# Patient Record
Sex: Male | Born: 1990 | Race: White | Hispanic: No | Marital: Single | Smoking: Never smoker
Health system: Southern US, Community
[De-identification: ages and names within clinical notes are randomized; demographics above are authoritative.]

## PROBLEM LIST (undated history)

## (undated) DIAGNOSIS — F988 Other specified behavioral and emotional disorders with onset usually occurring in childhood and adolescence: Secondary | ICD-10-CM

---
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
## 2008-03-09 ENCOUNTER — Encounter: Admission: RE | Admit: 2008-03-09 | Discharge: 2008-03-09 | Payer: Self-pay | Admitting: General Surgery

## 2008-11-04 ENCOUNTER — Emergency Department (HOSPITAL_COMMUNITY): Admission: EM | Admit: 2008-11-04 | Discharge: 2008-11-04 | Payer: Self-pay | Admitting: Emergency Medicine

## 2010-04-18 IMAGING — CT CT PELVIS W/ CM
2 of 5 series · 15 of 42 positions shown, 19 images · IV contrast (30CC OMNI 350 & [ID] OMNI 300)
Comparison: None available

 CT ABDOMEN

March 16, 2008 –DUPLICATE COPY for exam association in [facility]. No change from original report.
CLINICAL DATA: Abdominal pain. Evaluate for appendicitis.

 CT ABDOMEN AND PELVIS WITH CONTRAST
TECHNIQUE: Multidetector CT imaging of the abdomen and pelvis was
 performed using the standard protocol following bolus
 administration of intravenous contrast.
 Contrast: 100 ml [id number].

[Series 2: abdomen w/ · axial · 0.70mm/px · z∈[-378,-3]mm · 12 of 85 slices shown, 16 images]
[im 5/85  soft-tissue]
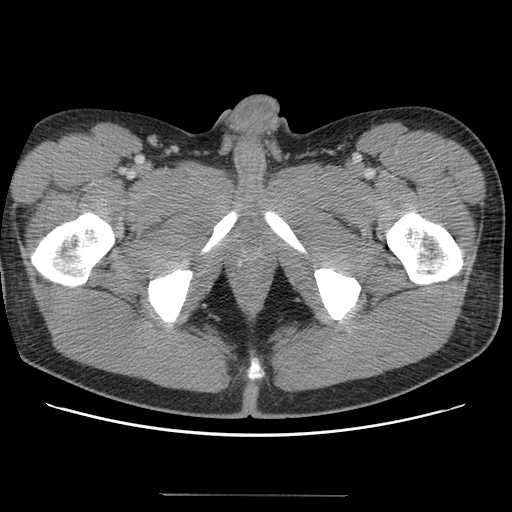
[im 5/85  bone]
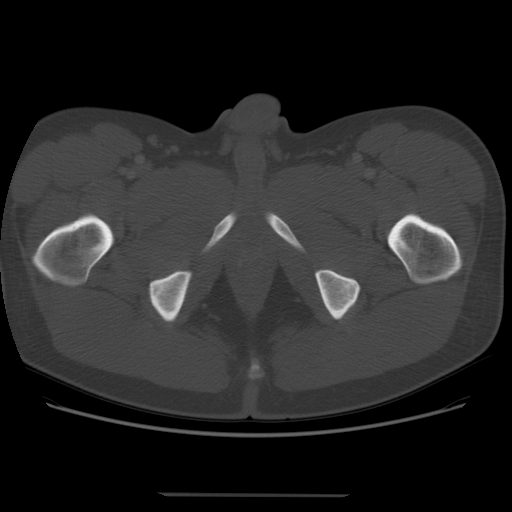
[im 14/85  soft-tissue]
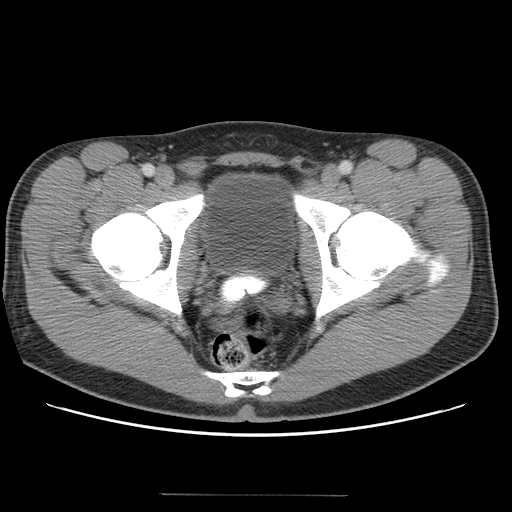
[im 23/85  soft-tissue]
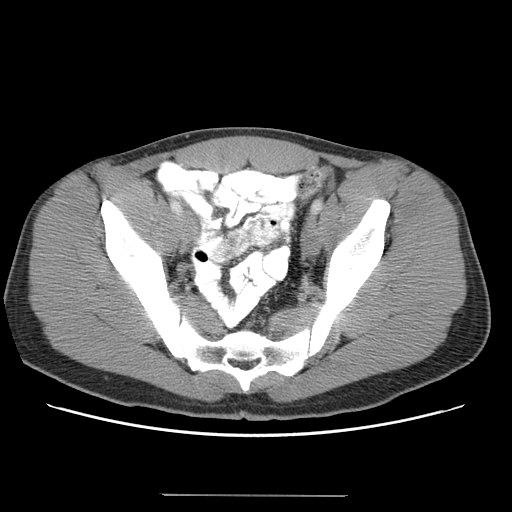
[im 31/85  soft-tissue]
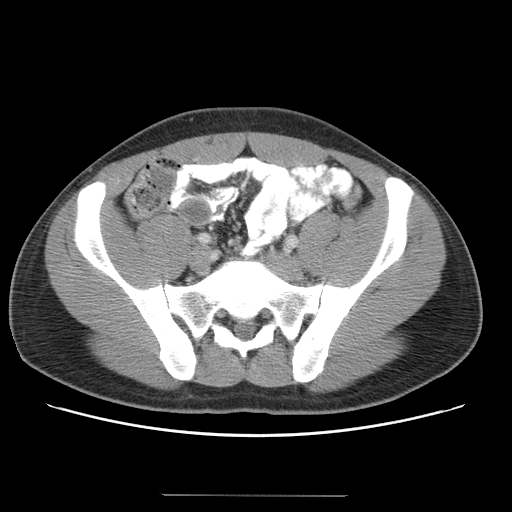
[im 40/85  soft-tissue]
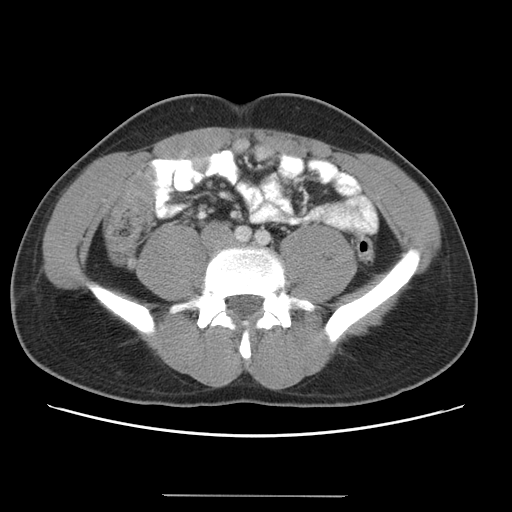
[im 45/85  soft-tissue]
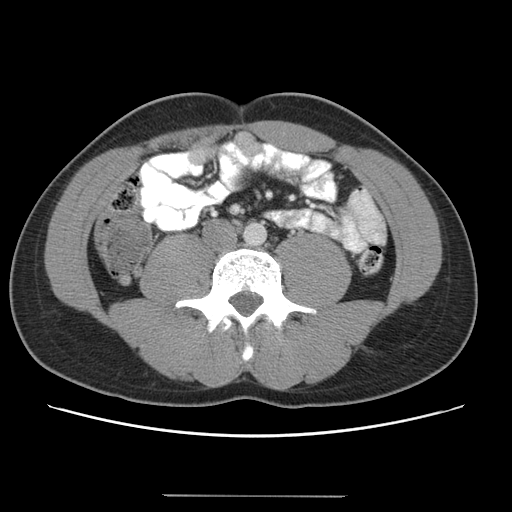
[im 54/85  soft-tissue]
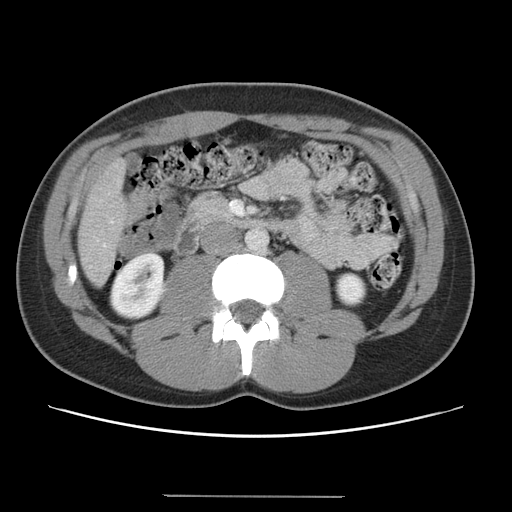
[im 62/85  soft-tissue]
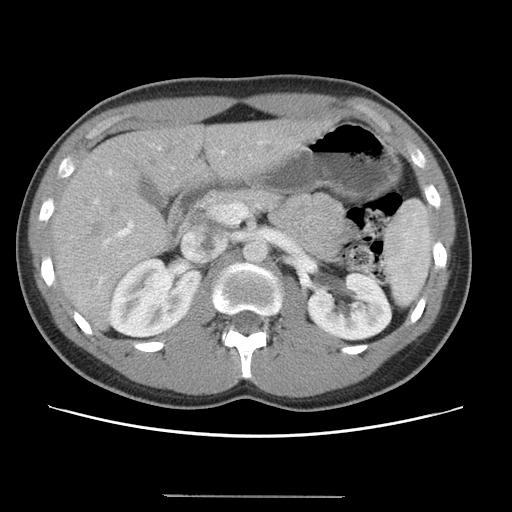
[im 67/85  lung]
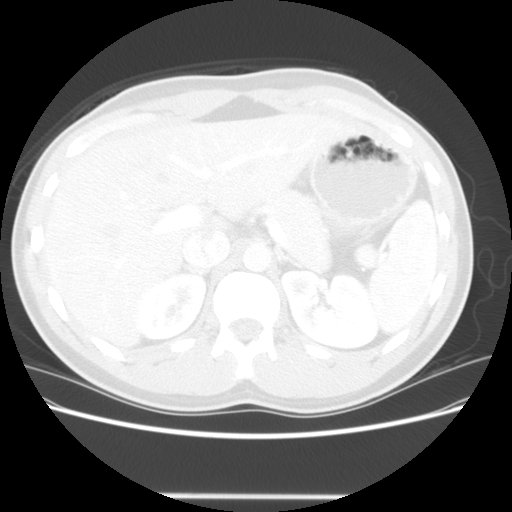
[im 71/85  soft-tissue]
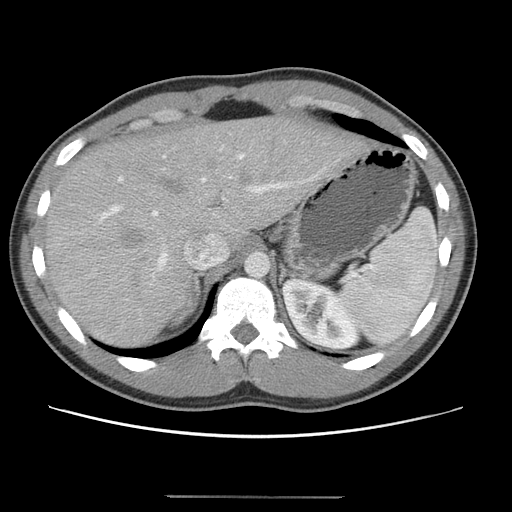
[im 71/85  lung]
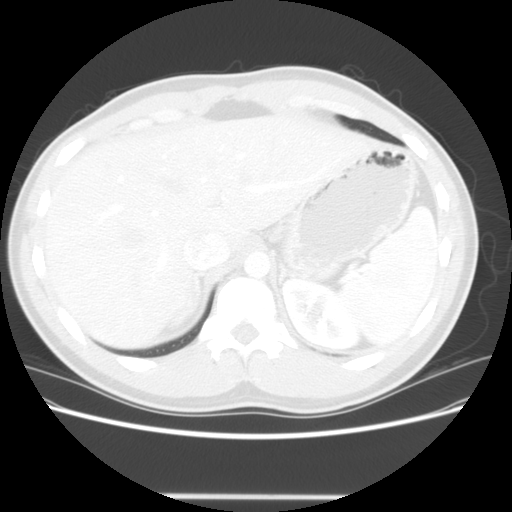
[im 71/85  bone]
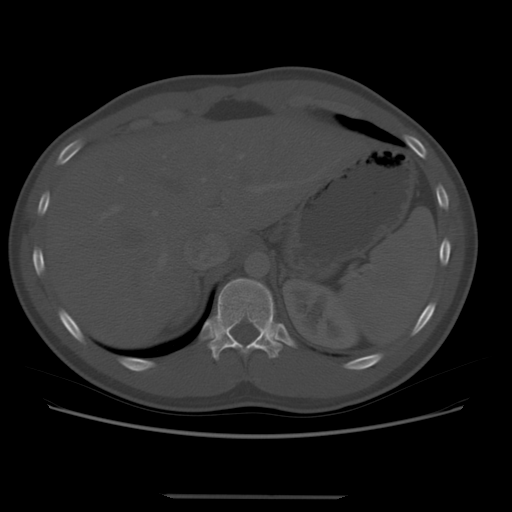
[im 76/85  lung]
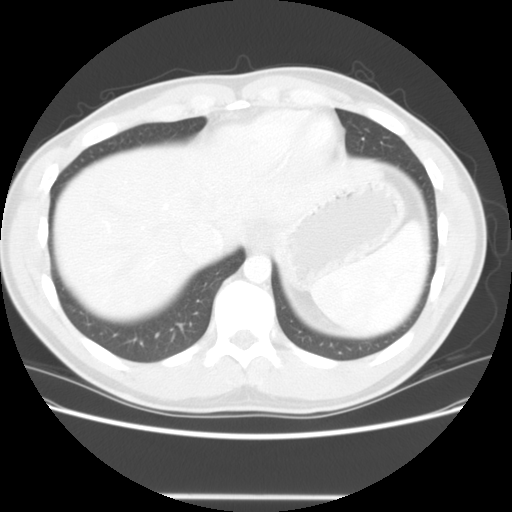
[im 80/85  soft-tissue]
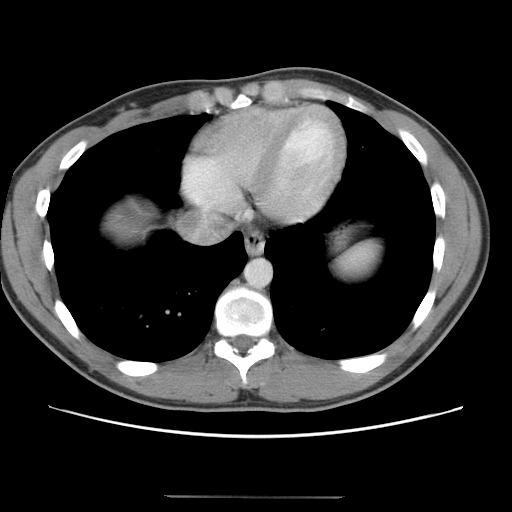
[im 80/85  lung]
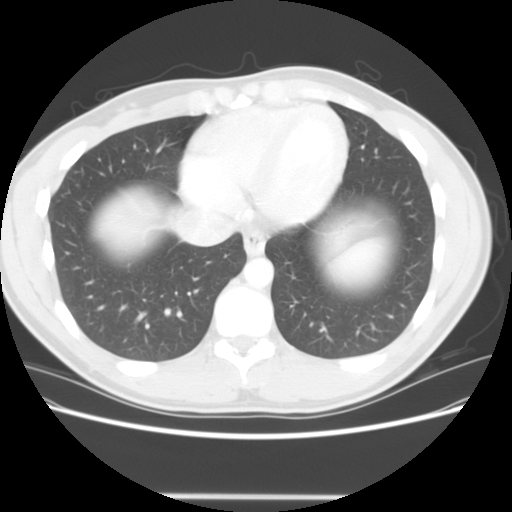

[Series 400: sagittal · sagittal · 0.85mm/px · 3 of 114 slices shown]
[im 29/114  soft-tissue]
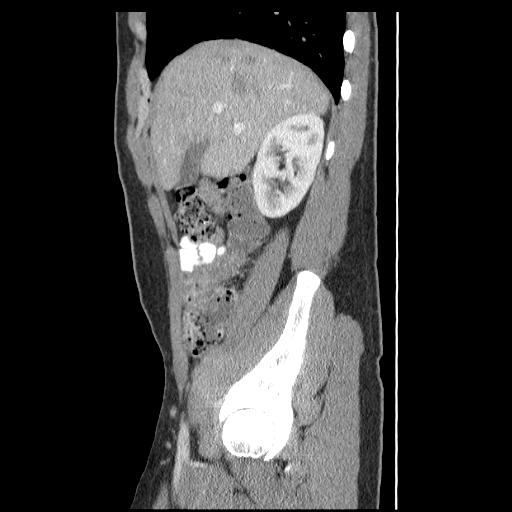
[im 57/114  soft-tissue]
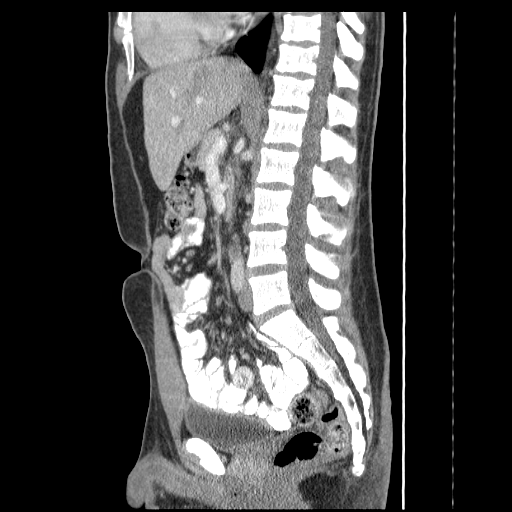
[im 85/114  soft-tissue]
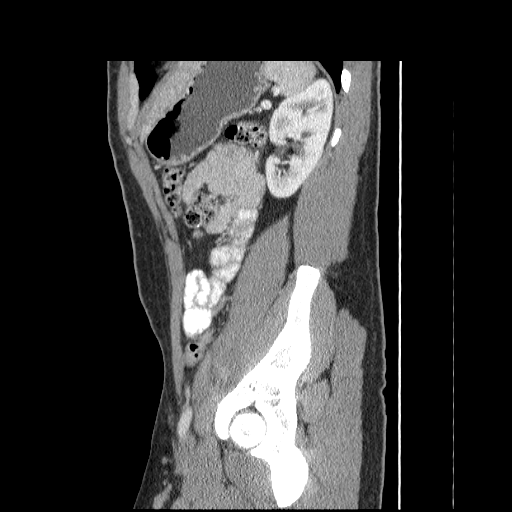

[15 of 42 positions shown; findings below may reference images not displayed]

FINDINGS: Lung bases clear. Liver and spleen appear within normal
 limits. Kidneys demonstrate normal nephrographic enhancement
 bilaterally. IVC and aorta and normal. Gallbladder is
 decompressed. The stomach and proximal small bowel appear normal.
 Pancreas and common bile duct appear normal.
IMPRESSION: No acute abdominal abnormality.

 CT PELVIS
FINDINGS: Moderate fecal burden is present. Normal appendix
 identified in a retrocecal location, tracking up towards the tip of
 the liver. The tip is filled with air and there are no
 inflammatory changes around the appendix. Pelvic small bowel
 appears within normal limits. No free fluid. Urinary bladder
 unremarkable. No free air is present in the abdomen. Bones appear
 within normal limits.
IMPRESSION: No acute pelvic abnormality. Normal appendix identified.

## 2010-12-14 IMAGING — CR DG CHEST 2V
2 series · 2 of 2 positions shown · non-contrast
Comparison: None

CLINICAL DATA: Status post fall; shortness of breath.

CHEST - 2 VIEW

[w chest pa]
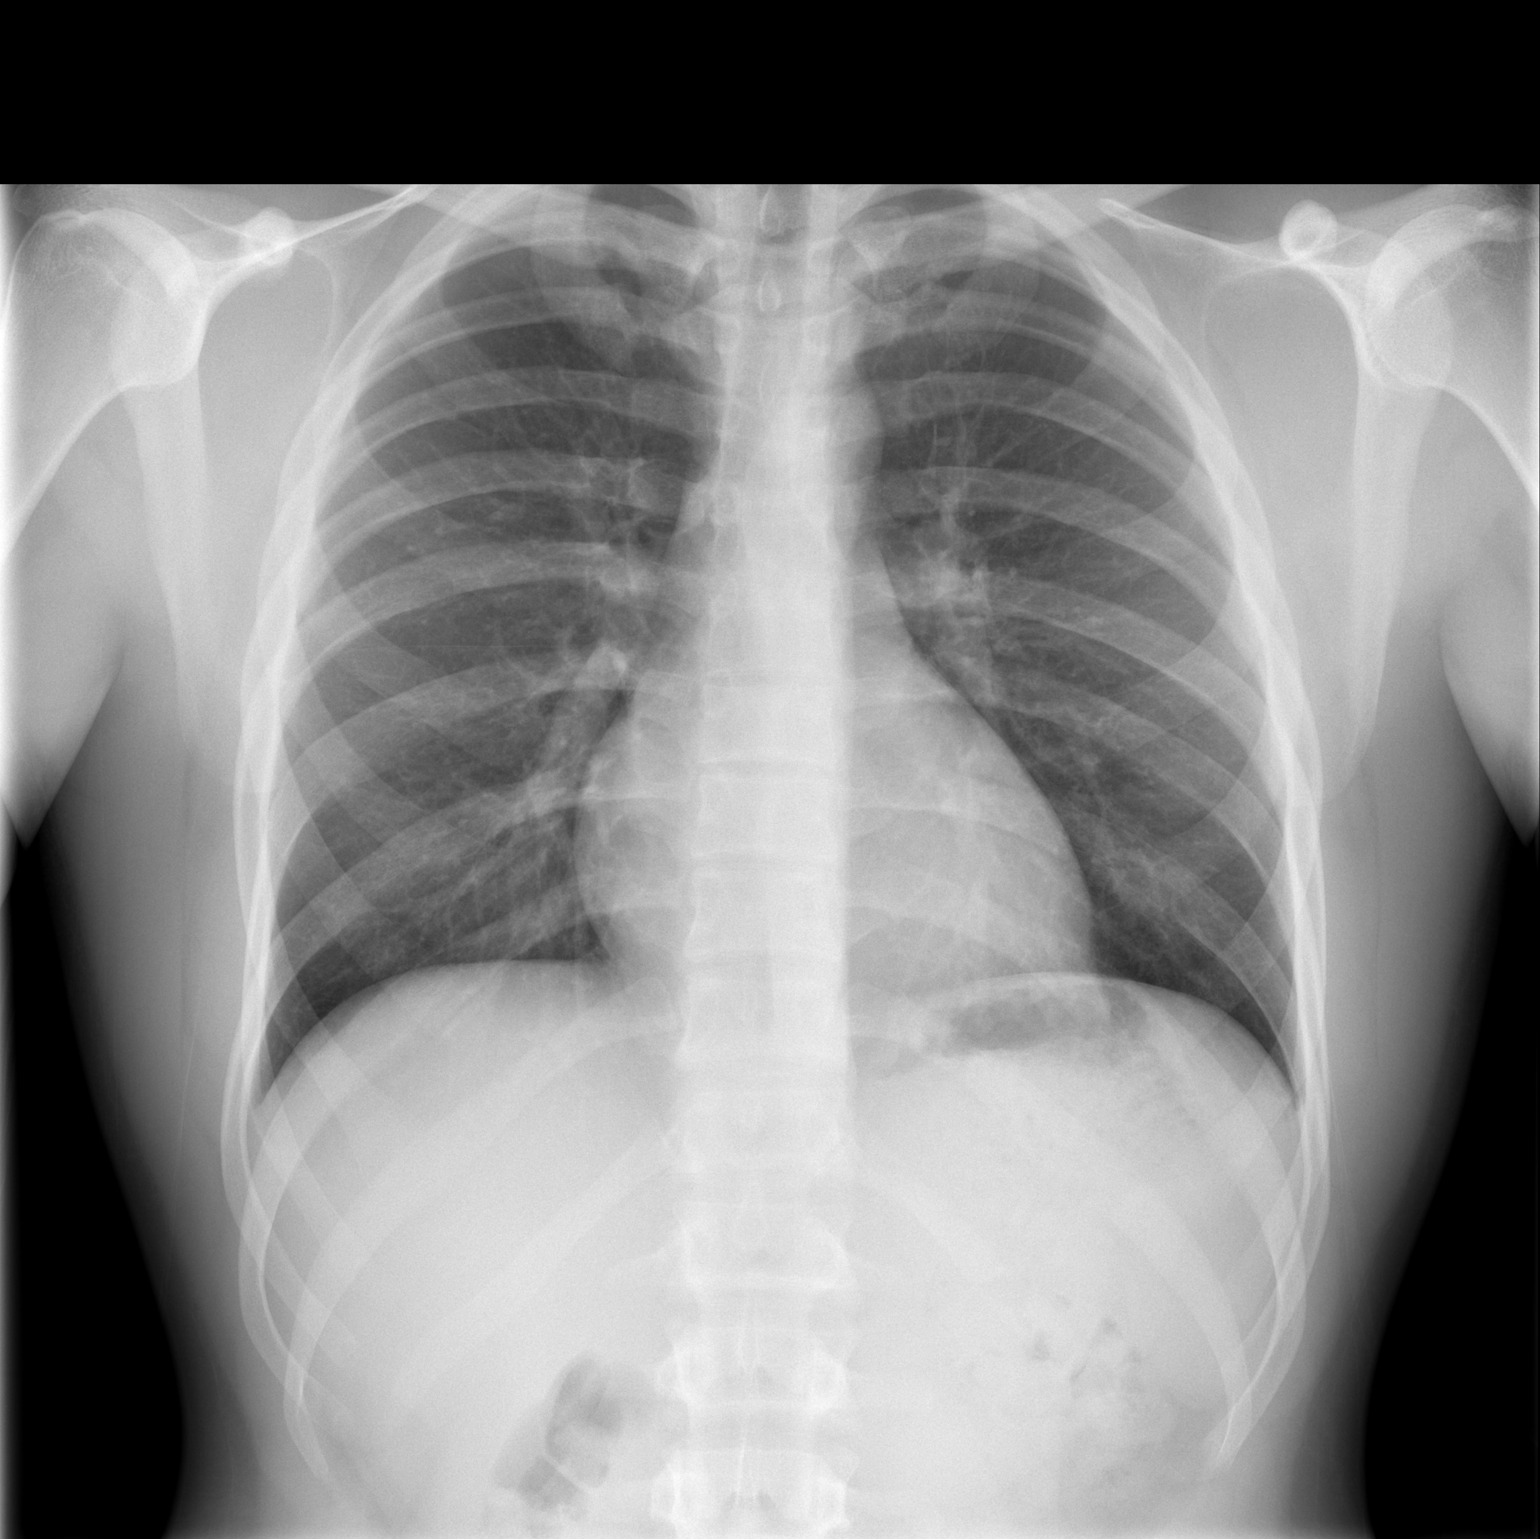

[w chest lat]
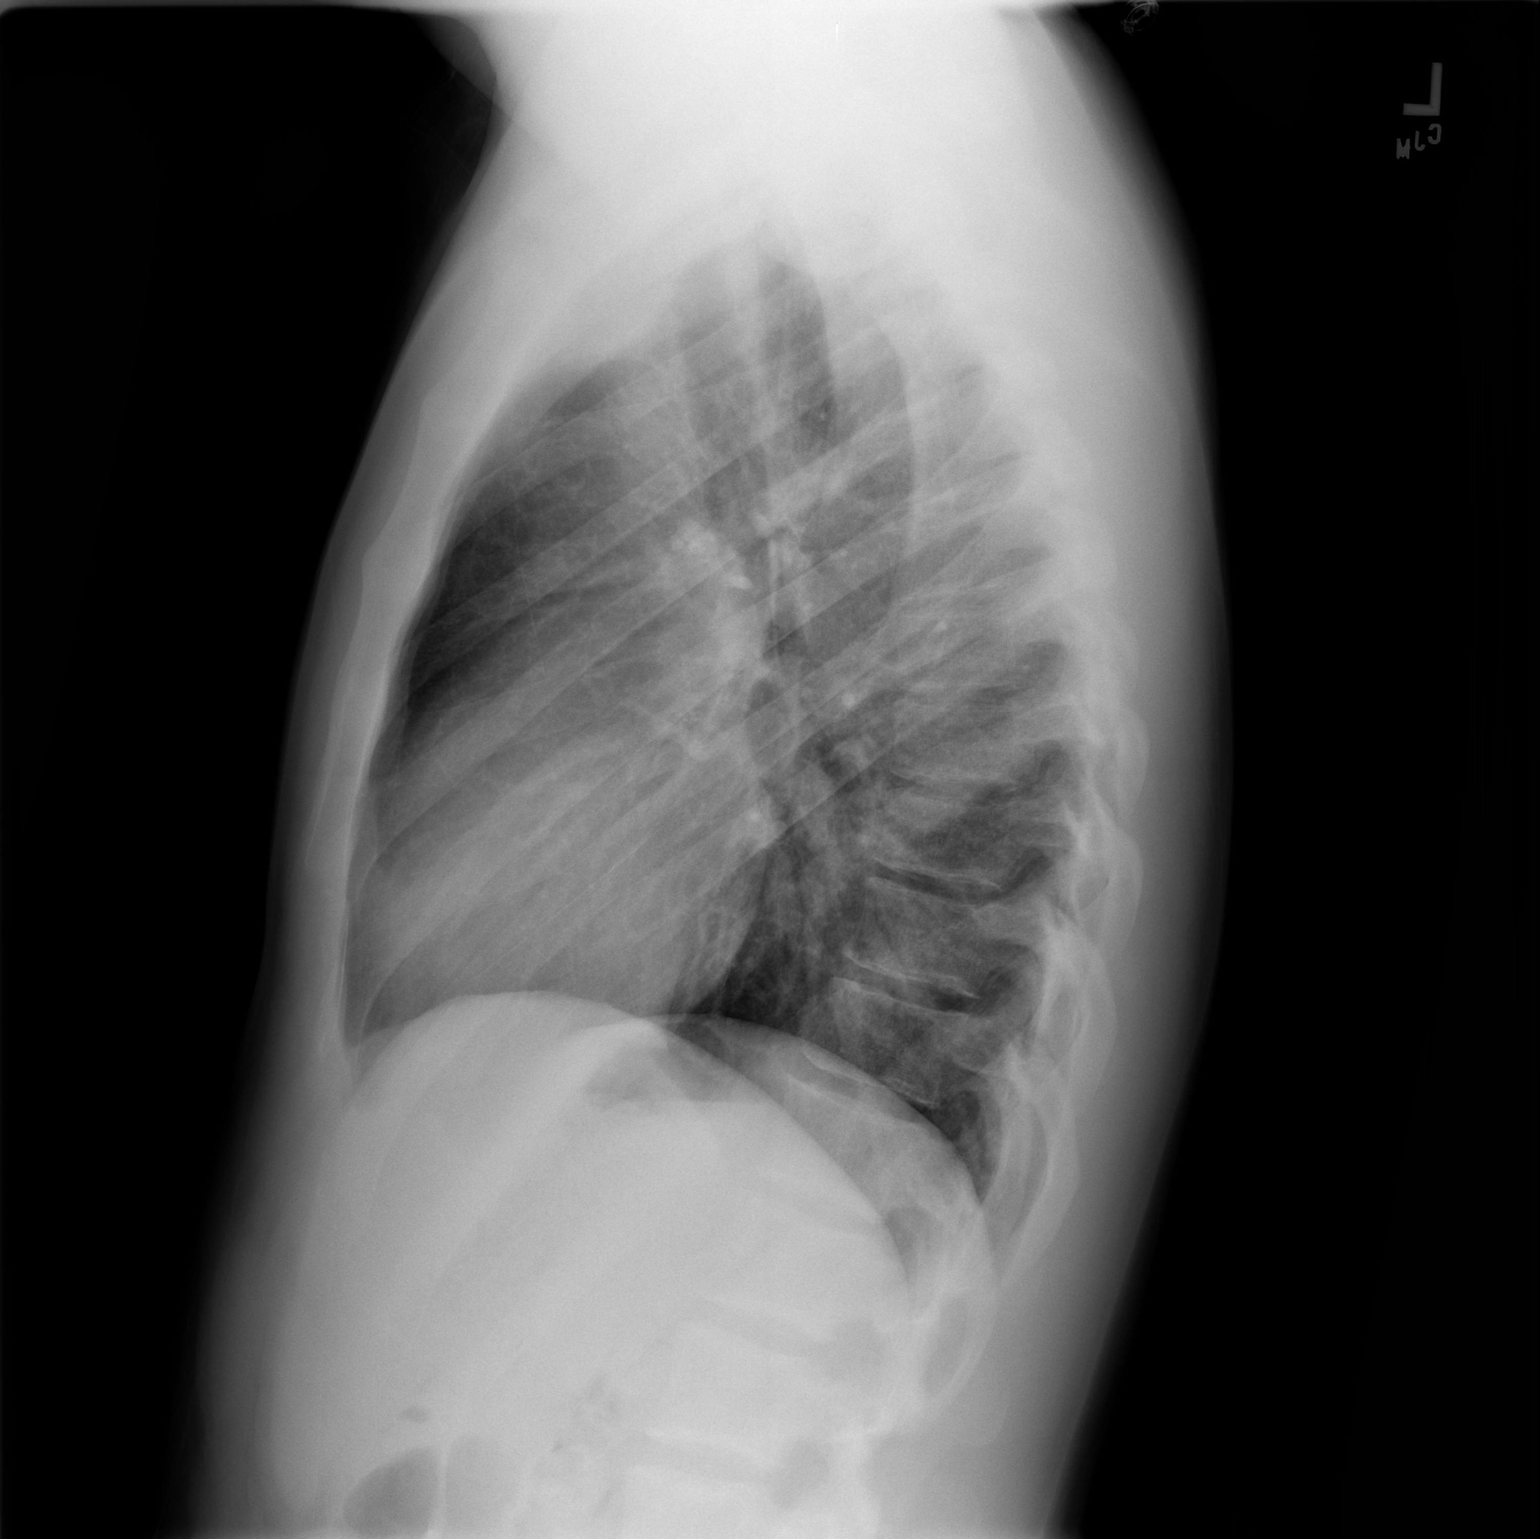

[2 of 2 positions shown; findings below may reference images not displayed]

FINDINGS: The lungs are well-aerated and clear.  There is no
evidence of focal opacification, pleural effusion or pneumothorax.

The heart is normal in size; the mediastinal contour is within
normal limits.  No displaced rib fractures are identified; no acute
osseous abnormalities are seen.
IMPRESSION: No displaced rib fractures seen; no acute cardiopulmonary process
evident.

## 2012-08-28 ENCOUNTER — Ambulatory Visit (INDEPENDENT_AMBULATORY_CARE_PROVIDER_SITE_OTHER): Payer: BC Managed Care – PPO | Admitting: General Surgery

## 2012-08-28 DIAGNOSIS — L02519 Cutaneous abscess of unspecified hand: Secondary | ICD-10-CM

## 2012-08-28 DIAGNOSIS — L03119 Cellulitis of unspecified part of limb: Secondary | ICD-10-CM

## 2012-08-28 MED ORDER — CEPHALEXIN 500 MG PO CAPS: 500.0000 mg | ORAL_CAPSULE | Freq: Four times a day (QID) | ORAL | Status: AC

## 2012-08-28 NOTE — Patient Instructions (Signed)
Cellulitis Cellulitis is an infection of the skin and the tissue beneath it. The infected area is usually red and tender. Cellulitis occurs most often in the arms and lower legs.  CAUSES  Cellulitis is caused by bacteria that enter the skin through cracks or cuts in the skin. The most common types of bacteria that cause cellulitis are Staphylococcus and Streptococcus. SYMPTOMS   Redness and warmth.  Swelling.  Tenderness or pain.  Fever. DIAGNOSIS  Your caregiver can usually determine what is wrong based on a physical exam. Blood tests may also be done. TREATMENT  Treatment usually involves taking an antibiotic medicine. HOME CARE INSTRUCTIONS   Take your antibiotics as directed. Finish them even if you start to feel better.  Keep the infected arm or leg elevated to reduce swelling.  Apply a warm cloth to the affected area up to 4 times per day to relieve pain.  Only take over-the-counter or prescription medicines for pain, discomfort, or fever as directed by your caregiver.  Keep all follow-up appointments as directed by your caregiver. SEEK MEDICAL CARE IF:   You notice red streaks coming from the infected area.  Your red area gets larger or turns dark in color.  Your bone or joint underneath the infected area becomes painful after the skin has healed.  Your infection returns in the same area or another area.  You notice a swollen bump in the infected area.  You develop new symptoms. SEEK IMMEDIATE MEDICAL CARE IF:   You have a fever.  You feel very sleepy.  You develop vomiting or diarrhea.  You have a general ill feeling (malaise) with muscle aches and pains. MAKE SURE YOU:   Understand these instructions.  Will watch your condition.  Will get help right away if you are not doing well or get worse. Document Released: 12/06/2004 Document Revised: 08/28/2011 Document Reviewed: 05/14/2011 [employer] Patient Information 2014 [employer].  

## 2012-08-28 NOTE — Progress Notes (Signed)
Chief complaint: Possible dissection after Scratch  History: Patient 2 days ago with scratch on the back of his left hand by his catheter. Last night and today he notices some increased redness and swelling on the back of his hand. No fever or chills.  Exam: There were no vitals taken for this visit. General: Healthy-appearing male Extremities: There are some superficial scratches on the dorsum of the left hand. There is mild to moderate erythema over the distal dorsum of the hand with 1+ swelling on the back of the hand. No purulence or fluctuance.  Assessment and plan: Mild cellulitis left hand after a cat scratch 2 days ago. I recommended Keflex 4 times a day with elevation. He is given instructions and understands to call immediately for any worsening symptoms.

## 2013-12-15 ENCOUNTER — Encounter (HOSPITAL_BASED_OUTPATIENT_CLINIC_OR_DEPARTMENT_OTHER): Payer: Self-pay | Admitting: Emergency Medicine

## 2013-12-15 ENCOUNTER — Emergency Department (HOSPITAL_BASED_OUTPATIENT_CLINIC_OR_DEPARTMENT_OTHER)
Admission: EM | Admit: 2013-12-15 | Discharge: 2013-12-16 | Disposition: A | Discharge status: Home / Self Care | Payer: BC Managed Care – PPO | Attending: Emergency Medicine | Admitting: Emergency Medicine

## 2013-12-15 DIAGNOSIS — S61203A Unspecified open wound of left middle finger without damage to nail, initial encounter: Secondary | ICD-10-CM | POA: Diagnosis not present

## 2013-12-15 DIAGNOSIS — Y9289 Other specified places as the place of occurrence of the external cause: Secondary | ICD-10-CM | POA: Insufficient documentation

## 2013-12-15 DIAGNOSIS — W270XXA Contact with workbench tool, initial encounter: Secondary | ICD-10-CM | POA: Insufficient documentation

## 2013-12-15 DIAGNOSIS — S61219A Laceration without foreign body of unspecified finger without damage to nail, initial encounter: Secondary | ICD-10-CM

## 2013-12-15 DIAGNOSIS — Z79899 Other long term (current) drug therapy: Secondary | ICD-10-CM | POA: Insufficient documentation

## 2013-12-15 DIAGNOSIS — Y9389 Activity, other specified: Secondary | ICD-10-CM | POA: Diagnosis not present

## 2013-12-15 DIAGNOSIS — F909 Attention-deficit hyperactivity disorder, unspecified type: Secondary | ICD-10-CM | POA: Diagnosis not present

## 2013-12-15 HISTORY — DX: Other specified behavioral and emotional disorders with onset usually occurring in childhood and adolescence: F98.8

## 2013-12-15 NOTE — ED Provider Notes (Signed)
CSN: [id number]     Arrival date & time 12/15/13  2143 History   First MD Initiated Contact with Patient 12/15/13 2328     Chief Complaint  Patient presents with  . Extremity Laceration     (Consider location/radiation/quality/duration/timing/severity/associated sxs/prior Treatment) HPI Comments: Pt c/o laceration to left ring finger by metal tool x 1 hr ago  Patient is a 23 y.o. male presenting with hand injury. The history is provided by the patient. No language interpreter was used.  Hand Injury Location:  Finger Time since incident:  1 hour Injury: yes   Mechanism of injury comment:  Lacerated finger w/ metal wood carving tool Finger location:  L middle finger Pain details:    Quality:  Aching   Radiates to:  Does not radiate   Severity:  Mild   Timing:  Constant   Progression:  Unchanged Chronicity:  New Handedness:  Right-handed Dislocation: no   Foreign body present:  No foreign bodies Tetanus status:  Up to date Prior injury to area:  No Relieved by:  Immobilization Worsened by:  Movement Associated symptoms: no back pain, no fatigue and no fever     Past Medical History  Diagnosis Date  . ADD (attention deficit disorder)    History reviewed. No pertinent past surgical history. History reviewed. No pertinent family history. History  Substance Use Topics  . Smoking status: Never Smoker   . Smokeless tobacco: Not on file  . Alcohol Use: No    Review of Systems  Constitutional: Negative for fever, activity change, appetite change and fatigue.  HENT: Negative for congestion, facial swelling, rhinorrhea and trouble swallowing.   Eyes: Negative for photophobia and pain.  Respiratory: Negative for cough, chest tightness and shortness of breath.   Cardiovascular: Negative for chest pain and leg swelling.  Gastrointestinal: Negative for nausea, vomiting, abdominal pain, diarrhea and constipation.  Endocrine: Negative for polydipsia and polyuria.  Genitourinary:  Negative for dysuria, urgency, decreased urine volume and difficulty urinating.  Musculoskeletal: Negative for back pain and gait problem.  Skin: Negative for color change, rash and wound.  Allergic/Immunologic: Negative for immunocompromised state.  Neurological: Negative for dizziness, facial asymmetry, speech difficulty, weakness, numbness and headaches.  Psychiatric/Behavioral: Negative for confusion, decreased concentration and agitation.      Allergies  Review of patient's allergies indicates no known allergies.  Home Medications   Prior to Admission medications   Medication Sig Start Date End Date Taking? Authorizing Provider  amphetamine-dextroamphetamine (ADDERALL) 15 MG tablet Take 15 mg by mouth daily.   Yes Historical Provider, MD   BP 123/78  Pulse 78  Temp(Src) 98.8 F (37.1 C) (Oral)  Resp 17  Ht 5\' 11"  (1.803 m)  Wt 215 lb (97.523 kg)  BMI 30.00 kg/m2  SpO2 100% Physical Exam  Constitutional: He is oriented to person, place, and time. He appears well-developed and well-nourished. No distress.  HENT:  Head: Normocephalic and atraumatic.  Mouth/Throat: No oropharyngeal exudate.  Eyes: Pupils are equal, round, and reactive to light.  Neck: Normal range of motion. Neck supple.  Cardiovascular: Normal rate, regular rhythm and normal heart sounds.  Exam reveals no gallop and no friction rub.   No murmur heard. Pulmonary/Chest: Effort normal and breath sounds normal. No respiratory distress. He has no wheezes. He has no rales.  Abdominal: Soft. Bowel sounds are normal. He exhibits no distension and no mass. There is no tenderness. There is no rebound and no guarding.  Musculoskeletal: Normal range of motion. He exhibits  no edema and no tenderness.       Left hand: He exhibits laceration.       Hands: Neurological: He is alert and oriented to person, place, and time.  Skin: Skin is warm and dry.  Psychiatric: He has a normal mood and affect.    ED Course    LACERATION REPAIR Date/Time: 12/15/2013 11:53 PM Performed by: Toy Cookey Authorized by: Toy Cookey Consent: Verbal consent obtained. written consent not obtained. Risks and benefits: risks, benefits and alternatives were discussed Consent given by: patient Patient understanding: patient states understanding of the procedure being performed Patient consent: the patient's understanding of the procedure matches consent given Procedure consent: procedure consent matches procedure scheduled Relevant documents: relevant documents present and verified Test results: test results available and properly labeled Site marked: the operative site was marked Imaging studies: imaging studies available Required items: required blood products, implants, devices, and special equipment available Patient identity confirmed: verbally with patient Time out: Immediately prior to procedure a "time out" was called to verify the correct patient, procedure, equipment, support staff and site/side marked as required. Body area: upper extremity Location details: left ring finger Laceration length: 2 cm Tendon involvement: none Nerve involvement: none Vascular damage: no Anesthesia: local infiltration and digital block Local anesthetic: lidocaine 1% without epinephrine Anesthetic total: 3 ml Patient sedated: no Preparation: Patient was prepped and draped in the usual sterile fashion. Irrigation solution: saline Irrigation method: syringe Amount of cleaning: standard Debridement: none Degree of undermining: none Skin closure: 5-0 Prolene Number of sutures: 5 Technique: simple Approximation: close Approximation difficulty: simple Dressing: antibiotic ointment, 4x4 sterile gauze and splint Patient tolerance: Patient tolerated the procedure well with no immediate complications.   (including critical care time) Labs Review Labs Reviewed - No data to display  Imaging Review No results found.    EKG Interpretation None      MDM   Final diagnoses:  Laceration of finger, left, initial encounter    Pt is a 23 y.o. male with Pmhx as above who presents with laceration to radial side of left ring finger from the woodcarving total approximately one hour ago. Tools were not soiled. Tdap UTD. He is neurovascular intact distally. There is no crush type injury. Laceration was repaired as above. Return precautions given for new worsening symptoms including signs or symptoms of infection. Suture removal recommended in approximately one week.        Toy Cookey, MD 12/16/13 [phone]

## 2013-12-15 NOTE — ED Notes (Signed)
Pt c/o laceration to left ring finger by metal tool x 1 hr ago

## 2013-12-16 NOTE — ED Notes (Signed)
Pt discharged to home with family. NAD.  

## 2013-12-16 NOTE — Discharge Instructions (Signed)

## 2015-06-23 DIAGNOSIS — F909 Attention-deficit hyperactivity disorder, unspecified type: Secondary | ICD-10-CM | POA: Diagnosis not present

## 2015-12-21 DIAGNOSIS — Z23 Encounter for immunization: Secondary | ICD-10-CM | POA: Diagnosis not present

## 2016-02-29 DIAGNOSIS — F909 Attention-deficit hyperactivity disorder, unspecified type: Secondary | ICD-10-CM | POA: Diagnosis not present

## 2016-09-03 DIAGNOSIS — F909 Attention-deficit hyperactivity disorder, unspecified type: Secondary | ICD-10-CM | POA: Diagnosis not present

## 2016-12-17 DIAGNOSIS — B349 Viral infection, unspecified: Secondary | ICD-10-CM | POA: Diagnosis not present

## 2016-12-17 DIAGNOSIS — J029 Acute pharyngitis, unspecified: Secondary | ICD-10-CM | POA: Diagnosis not present

## 2017-02-15 DIAGNOSIS — F909 Attention-deficit hyperactivity disorder, unspecified type: Secondary | ICD-10-CM | POA: Diagnosis not present

## 2017-08-15 DIAGNOSIS — F4322 Adjustment disorder with anxiety: Secondary | ICD-10-CM | POA: Diagnosis not present

## 2017-09-23 DIAGNOSIS — R030 Elevated blood-pressure reading, without diagnosis of hypertension: Secondary | ICD-10-CM | POA: Diagnosis not present

## 2017-09-23 DIAGNOSIS — F909 Attention-deficit hyperactivity disorder, unspecified type: Secondary | ICD-10-CM | POA: Diagnosis not present

## 2017-12-03 DIAGNOSIS — Z23 Encounter for immunization: Secondary | ICD-10-CM | POA: Diagnosis not present

## 2018-04-07 DIAGNOSIS — F909 Attention-deficit hyperactivity disorder, unspecified type: Secondary | ICD-10-CM | POA: Diagnosis not present

## 2018-11-06 DIAGNOSIS — F909 Attention-deficit hyperactivity disorder, unspecified type: Secondary | ICD-10-CM | POA: Diagnosis not present

## 2019-04-09 DIAGNOSIS — F909 Attention-deficit hyperactivity disorder, unspecified type: Secondary | ICD-10-CM | POA: Diagnosis not present

## 2019-11-25 DIAGNOSIS — Z23 Encounter for immunization: Secondary | ICD-10-CM | POA: Diagnosis not present

## 2019-11-25 DIAGNOSIS — F909 Attention-deficit hyperactivity disorder, unspecified type: Secondary | ICD-10-CM | POA: Diagnosis not present

## 2019-11-25 DIAGNOSIS — B351 Tinea unguium: Secondary | ICD-10-CM | POA: Diagnosis not present

## 2019-11-25 DIAGNOSIS — Z79899 Other long term (current) drug therapy: Secondary | ICD-10-CM | POA: Diagnosis not present

## 2020-08-15 DIAGNOSIS — F909 Attention-deficit hyperactivity disorder, unspecified type: Secondary | ICD-10-CM | POA: Diagnosis not present

## 2021-04-12 DIAGNOSIS — F909 Attention-deficit hyperactivity disorder, unspecified type: Secondary | ICD-10-CM | POA: Diagnosis not present

## 2021-04-12 DIAGNOSIS — R030 Elevated blood-pressure reading, without diagnosis of hypertension: Secondary | ICD-10-CM | POA: Diagnosis not present

## 2021-12-08 DIAGNOSIS — I10 Essential (primary) hypertension: Secondary | ICD-10-CM | POA: Diagnosis not present

## 2021-12-08 DIAGNOSIS — F909 Attention-deficit hyperactivity disorder, unspecified type: Secondary | ICD-10-CM | POA: Diagnosis not present

## 2021-12-08 DIAGNOSIS — Z23 Encounter for immunization: Secondary | ICD-10-CM | POA: Diagnosis not present

## 2022-01-02 DIAGNOSIS — R509 Fever, unspecified: Secondary | ICD-10-CM | POA: Diagnosis not present

## 2022-01-02 DIAGNOSIS — U071 COVID-19: Secondary | ICD-10-CM | POA: Diagnosis not present

## 2022-01-15 DIAGNOSIS — Z0000 Encounter for general adult medical examination without abnormal findings: Secondary | ICD-10-CM | POA: Diagnosis not present

## 2022-01-15 DIAGNOSIS — Z23 Encounter for immunization: Secondary | ICD-10-CM | POA: Diagnosis not present

## 2022-01-15 DIAGNOSIS — Z13220 Encounter for screening for lipoid disorders: Secondary | ICD-10-CM | POA: Diagnosis not present

## 2022-03-14 DIAGNOSIS — U071 COVID-19: Secondary | ICD-10-CM | POA: Diagnosis not present

## 2022-07-19 DIAGNOSIS — F909 Attention-deficit hyperactivity disorder, unspecified type: Secondary | ICD-10-CM | POA: Diagnosis not present
